# Patient Record
Sex: Female | Born: 2000 | Race: Black or African American | Hispanic: No | Marital: Single | Smoking: Never smoker
Health system: Southern US, Community
[De-identification: ages and names within clinical notes are randomized; demographics above are authoritative.]

## PROBLEM LIST (undated history)

## (undated) DIAGNOSIS — N200 Calculus of kidney: Secondary | ICD-10-CM

## (undated) DIAGNOSIS — J45909 Unspecified asthma, uncomplicated: Secondary | ICD-10-CM

## (undated) HISTORY — PX: LITHOTRIPSY: SUR834

---

## 2020-03-04 ENCOUNTER — Ambulatory Visit: Payer: Medicaid Other | Attending: Family

## 2020-03-04 DIAGNOSIS — Z23 Encounter for immunization: Secondary | ICD-10-CM

## 2020-03-11 ENCOUNTER — Other Ambulatory Visit: Payer: Self-pay

## 2020-03-23 NOTE — Progress Notes (Signed)
   Covid-19 Vaccination Clinic  Name:  Abagale Boulos    MRN: [id number] DOB: 08/27/2000  03/23/2020  Ms. Lees was observed post Covid-19 immunization for 15 minutes without incident. She was provided with Vaccine Information Sheet and instruction to access the V-Safe system.   Ms. Bhattacharyya was instructed to call 911 with any severe reactions post vaccine: Marland Kitchen Difficulty breathing  . Swelling of face and throat  . A fast heartbeat  . A bad rash all over body  . Dizziness and weakness   Immunizations Administered    Name Date Dose VIS Date Route   Pfizer COVID-19 Vaccine 03/04/2020  1:45 PM 0.3 mL 08/19/2018 Intramuscular   Manufacturer: [employer], [employer]   Lot: [id number]   NDC: [address]-9

## 2020-03-25 ENCOUNTER — Ambulatory Visit: Payer: Medicaid Other | Attending: Family

## 2020-03-25 DIAGNOSIS — Z23 Encounter for immunization: Secondary | ICD-10-CM

## 2020-05-02 NOTE — Progress Notes (Signed)
   Covid-19 Vaccination Clinic  Name:  Kelsey Oconnell    MRN: [id number] DOB: [birth year]  05/02/2020  [name] was observed post Covid-19 immunization for 15 minutes without incident. She was provided with Vaccine Information Sheet and instruction to access the V-Safe system.   [name] was instructed to call 911 with any severe reactions post vaccine: Marland Kitchen Difficulty breathing  . Swelling of face and throat  . A fast heartbeat  . A bad rash all over body  . Dizziness and weakness   Immunizations Administered    Name Date Dose VIS Date Route   Pfizer COVID-19 Vaccine 03/25/2020  5:00 PM 0.3 mL 04/13/2020 Intramuscular   Manufacturer: [employer], [employer]   Lot: [id number]   NDC: [address]-8

## 2020-12-08 ENCOUNTER — Encounter (HOSPITAL_COMMUNITY): Payer: Self-pay

## 2020-12-08 ENCOUNTER — Emergency Department (HOSPITAL_COMMUNITY): Payer: Medicaid Other

## 2020-12-08 ENCOUNTER — Emergency Department (HOSPITAL_COMMUNITY)
Admission: EM | Admit: 2020-12-08 | Discharge: 2020-12-08 | Disposition: A | Discharge status: Home / Self Care | Payer: Medicaid Other | Attending: Emergency Medicine | Admitting: Emergency Medicine

## 2020-12-08 DIAGNOSIS — N200 Calculus of kidney: Secondary | ICD-10-CM

## 2020-12-08 DIAGNOSIS — R1084 Generalized abdominal pain: Secondary | ICD-10-CM | POA: Diagnosis present

## 2020-12-08 DIAGNOSIS — R001 Bradycardia, unspecified: Secondary | ICD-10-CM | POA: Insufficient documentation

## 2020-12-08 DIAGNOSIS — R109 Unspecified abdominal pain: Secondary | ICD-10-CM

## 2020-12-08 DIAGNOSIS — J45909 Unspecified asthma, uncomplicated: Secondary | ICD-10-CM | POA: Insufficient documentation

## 2020-12-08 HISTORY — DX: Calculus of kidney: N20.0

## 2020-12-08 HISTORY — DX: Unspecified asthma, uncomplicated: J45.909

## 2020-12-08 LAB — CBC WITH DIFFERENTIAL/PLATELET
Abs Immature Granulocytes: 0.02 10*3/uL (ref 0.00–0.07)
Basophils Absolute: 0.1 10*3/uL (ref 0.0–0.1)
Basophils Relative: 1 %
Eosinophils Absolute: 0.2 10*3/uL (ref 0.0–0.5)
Eosinophils Relative: 2 %
HCT: 43.6 % (ref 36.0–46.0)
Hemoglobin: 14.5 g/dL (ref 12.0–15.0)
Immature Granulocytes: 0 %
Lymphocytes Relative: 21 %
Lymphs Abs: 1.5 10*3/uL (ref 0.7–4.0)
MCH: 29.5 pg (ref 26.0–34.0)
MCHC: 33.3 g/dL (ref 30.0–36.0)
MCV: 88.6 fL (ref 80.0–100.0)
Monocytes Absolute: 0.4 10*3/uL (ref 0.1–1.0)
Monocytes Relative: 6 %
Neutro Abs: 5 10*3/uL (ref 1.7–7.7)
Neutrophils Relative %: 70 %
Platelets: 251 10*3/uL (ref 150–400)
RBC: 4.92 MIL/uL (ref 3.87–5.11)
RDW: 13.3 % (ref 11.5–15.5)
WBC: 7.1 10*3/uL (ref 4.0–10.5)
nRBC: 0 % (ref 0.0–0.2)

## 2020-12-08 LAB — LIPASE, BLOOD: Lipase: 46 U/L (ref 11–51)

## 2020-12-08 LAB — URINALYSIS, MICROSCOPIC (REFLEX)
Bacteria, UA: NONE SEEN
RBC / HPF: >50 RBC/hpf (ref 0–5)

## 2020-12-08 LAB — COMPREHENSIVE METABOLIC PANEL
ALT: 16 U/L (ref 0–44)
AST: 19 U/L (ref 15–41)
Albumin: 4 g/dL (ref 3.5–5.0)
Alkaline Phosphatase: 53 U/L (ref 38–126)
Anion gap: 8 (ref 5–15)
BUN: 13 mg/dL (ref 6–20)
CO2: 27 mmol/L (ref 22–32)
Calcium: 9 mg/dL (ref 8.9–10.3)
Chloride: 106 mmol/L (ref 98–111)
Creatinine, Ser: 0.75 mg/dL (ref 0.44–1.00)
GFR, Estimated: >60 mL/min (ref >60)
Glucose, Bld: 100 mg/dL — ABNORMAL HIGH (ref 70–99)
Potassium: 3.5 mmol/L (ref 3.5–5.1)
Sodium: 141 mmol/L (ref 135–145)
Total Bilirubin: 1.5 mg/dL — ABNORMAL HIGH (ref 0.3–1.2)
Total Protein: 7.1 g/dL (ref 6.5–8.1)

## 2020-12-08 LAB — URINALYSIS, ROUTINE W REFLEX MICROSCOPIC
Bilirubin Urine: NEGATIVE
Glucose, UA: NEGATIVE mg/dL
Hgb urine dipstick: NEGATIVE
Ketones, ur: NEGATIVE mg/dL
Leukocytes,Ua: NEGATIVE
Nitrite: NEGATIVE
Protein, ur: 100 mg/dL — AB
Specific Gravity, Urine: 1.024 (ref 1.005–1.030)
pH: 5 (ref 5.0–8.0)

## 2020-12-08 LAB — I-STAT BETA HCG BLOOD, ED (MC, WL, AP ONLY): I-stat hCG, quantitative: <5 m[IU]/mL (ref <5)

## 2020-12-08 MED ORDER — KETOROLAC TROMETHAMINE 15 MG/ML IJ SOLN
15.0000 mg | Freq: Once | INTRAMUSCULAR | Status: AC
  Administered 2020-12-08: 15 mg via INTRAVENOUS
  Filled 2020-12-08: qty 1

## 2020-12-08 MED ORDER — SODIUM CHLORIDE 0.9 % IV BOLUS
500.0000 mL | Freq: Once | INTRAVENOUS | Status: AC
  Administered 2020-12-08: 500 mL via INTRAVENOUS

## 2020-12-08 MED ORDER — FENTANYL CITRATE (PF) 100 MCG/2ML IJ SOLN
50.0000 ug | INTRAMUSCULAR | Status: DC | PRN
  Administered 2020-12-08: 50 ug via INTRAVENOUS
  Filled 2020-12-08: qty 2

## 2020-12-08 NOTE — Discharge Instructions (Signed)
Use Tylenol every 4 hours and ibuprofen every 6 hours as needed for pain.  Stay well-hydrated.  For severe pain you can take Tylenol and ibuprofen every 6 hours together.  Strain to collect kidney stone.  Follow-up with urology closely.

## 2020-12-08 NOTE — ED Provider Notes (Signed)
[facility] EMERGENCY DEPARTMENT Provider Note   CSN: [id number] Arrival date & time: 12/08/20  8341     History Chief Complaint  Patient presents with   Back Pain    Kelsey Oconnell is a 20 y.o. female.  Patient with history of kidney stone, lithotripsy presents with worsening right flank pain radiating to anterior abdomen.  Worsening for the past 2 days initially intermittent now constant.  Pain now diffuse abdominal area.  Decreased appetite.  No vomiting, diarrhea, blood in the stools or dysuria.  Mild blood in the urine.  No concerns for STDs.  No other abdominal surgeries or procedures.      Past Medical History:  Diagnosis Date   Asthma    Kidney stones     There are no problems to display for this patient.   Past Surgical History:  Procedure Laterality Date   LITHOTRIPSY       OB History   No obstetric history on file.     No family history on file.     Home Medications Prior to Admission medications   Not on File    Allergies    Patient has no known allergies.  Review of Systems   Review of Systems  Constitutional:  Negative for chills and fever.  HENT:  Negative for congestion.   Eyes:  Negative for visual disturbance.  Respiratory:  Negative for shortness of breath.   Cardiovascular:  Negative for chest pain.  Gastrointestinal:  Positive for abdominal pain and nausea. Negative for vomiting.  Genitourinary:  Negative for dysuria and flank pain.  Musculoskeletal:  Negative for back pain, neck pain and neck stiffness.  Skin:  Negative for rash.  Neurological:  Negative for light-headedness and headaches.   Physical Exam Updated Vital Signs BP 105/69   Pulse (!) 56   Temp 98.2 F (36.8 C) (Oral)   Resp 12   SpO2 100%   Physical Exam Vitals and nursing note reviewed.  Constitutional:      General: She is not in acute distress.    Appearance: She is well-developed.  HENT:     Head: Normocephalic and atraumatic.      Mouth/Throat:     Mouth: Mucous membranes are moist.  Eyes:     General:        Right eye: No discharge.        Left eye: No discharge.     Conjunctiva/sclera: Conjunctivae normal.  Neck:     Trachea: No tracheal deviation.  Cardiovascular:     Rate and Rhythm: Regular rhythm. Bradycardia present.     Heart sounds: No murmur heard. Pulmonary:     Effort: Pulmonary effort is normal.     Breath sounds: Normal breath sounds.  Abdominal:     General: There is no distension.     Palpations: Abdomen is soft.     Tenderness: There is abdominal tenderness (mid abdomen diffuse). There is no guarding.  Musculoskeletal:        General: Tenderness (right flank) present. No swelling.     Cervical back: Normal range of motion and neck supple. No rigidity.  Skin:    General: Skin is warm.     Capillary Refill: Capillary refill takes less than 2 seconds.     Findings: No rash.  Neurological:     General: No focal deficit present.     Mental Status: She is alert.     Cranial Nerves: No cranial nerve deficit.  Psychiatric:  Mood and Affect: Mood normal.    ED Results / Procedures / Treatments   Labs (all labs ordered are listed, but only abnormal results are displayed) Labs Reviewed  URINALYSIS, ROUTINE W REFLEX MICROSCOPIC - Abnormal; Notable for the following components:      Result Value   Color, Urine AMBER (*)    APPearance CLOUDY (*)    Protein, ur 100 (*)    All other components within normal limits  COMPREHENSIVE METABOLIC PANEL - Abnormal; Notable for the following components:   Glucose, Bld 100 (*)    Total Bilirubin 1.5 (*)    All other components within normal limits  URINALYSIS, MICROSCOPIC (REFLEX)  CBC WITH DIFFERENTIAL/PLATELET  LIPASE, BLOOD  I-STAT BETA HCG BLOOD, ED ([facility], [facility], [facility] ONLY)    EKG None  Radiology CT Renal Stone Study  Result Date: 12/08/2020 CLINICAL DATA:  Right flank pain and urinary frequency for 2 days, RIGHT flank and abdominal pain,  suspected kidney stone EXAM: CT ABDOMEN AND PELVIS WITHOUT CONTRAST TECHNIQUE: Multidetector CT imaging of the abdomen and pelvis was performed following the standard protocol without IV contrast. Sagittal and coronal MPR images reconstructed from axial data set. No oral contrast administered. COMPARISON:  None FINDINGS: Lower chest: Lung bases clear Hepatobiliary: Gallbladder and liver normal appearance Pancreas: Normal appearance Spleen: Normal appearance Adrenals/Urinary Tract: Adrenal glands normal appearance. BILATERAL nonobstructing renal calculi. Mild RIGHT hydronephrosis. In addition, calculus identified inferior to the RIGHT kidney 5 mm diameter image 34, question UPJ calculus; due to lack of fat planes, position of the ureteropelvic junction is not well delineated. No calcifications along the expected courses of the ureters. Bladder unremarkable. Stomach/Bowel: Stomach and bowel loops grossly unremarkable for technique Vascular/Lymphatic: Aorta normal caliber.  No adenopathy. Reproductive: Unremarkable uterus and adnexa Other: No free air or free fluid.  No hernia. Musculoskeletal: Osseous structures unremarkable. IMPRESSION: BILATERAL nonobstructing renal calculi. Mild RIGHT hydronephrosis due to a 5 mm probable UPJ calculus. Electronically Signed   By: Ulyses Southward M.D.   On: 12/08/2020 10:12    Procedures Procedures   Medications Ordered in ED Medications  fentaNYL (SUBLIMAZE) injection 50 mcg (50 mcg Intravenous Given 12/08/20 0828)  sodium chloride 0.9 % bolus 500 mL (0 mLs Intravenous Stopped 12/08/20 0928)  ketorolac (TORADOL) 15 MG/ML injection 15 mg (15 mg Intravenous Given 12/08/20 4656)    ED Course  I have reviewed the triage vital signs and the nursing notes.  Pertinent labs & imaging results that were available during my care of the patient were reviewed by me and considered in my medical decision making (see chart for details).    MDM Rules/Calculators/A&P                           Patient presents with worsening flank and abdominal pain.  Patient very uncomfortable in the room.  Pain medicines ordered, urine to look for signs of infection, bleeding, basic blood work to check kidney function, hemoglobin white blood cell count.  Patient's pain improved in the ER, pregnancy test negative, Toradol added to the fentanyl initial dose.  Urinalysis no signs of infection, RBCs present.  Concern clinically for kidney stone versus pyelo vs musculoskeletal versus bowel related versus appendicitis versus other.  CT stone study pending.  Blood work reviewed reassuring, normal kidney function, normal hemoglobin, normal electrolytes, normal white blood cell count.  Urinalysis showed RBCs without signs of infection.  CT scan results reviewed showing kidney stone 5 mm.  Pain controlled on reassessment.  Follow-up with urology discussed.  Final Clinical Impression(s) / ED Diagnoses Final diagnoses:  Acute right flank pain  Kidney stone    Rx / DC Orders ED Discharge Orders     None        Blane Ohara, MD 12/08/20 1134

## 2020-12-08 NOTE — ED Provider Notes (Signed)
Emergency Medicine Provider Triage Evaluation Note  Kelsey Oconnell , a 20 y.o. female  was evaluated in triage.  Pt complains of back pain on and off for the past 2 days.  Localized to lower mid back.  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  Denies urinary symptoms.  No meds taken PTA.  Review of Systems  Positive: Back pain Negative: Numbness, weakness  Physical Exam  BP (!) 131/101 (BP Location: Left Arm)   Pulse 73   Temp 98.2 F (36.8 C) (Oral)   Resp 17   Gen:   Awake, no distress   Resp:  Normal effort  MSK:   Moves extremities without difficulty   Medical Decision Making  Medically screening exam initiated at 6:29 AM.  Appropriate orders placed.  Kelsey Oconnell was informed that the remainder of the evaluation will be completed by another provider, this initial triage assessment does not replace that evaluation, and the importance of remaining in the ED until their evaluation is complete.    Kelsey Hatchet, PA-C 12/08/20 0631    Tegeler, Kelsey Brim, MD 12/08/20 [phone]

## 2020-12-08 NOTE — ED Triage Notes (Signed)
Pt states that she has been having lower back pain on and off for the past two days,denies injury or urinary symptoms

## 2020-12-12 ENCOUNTER — Other Ambulatory Visit: Payer: Self-pay | Admitting: Urology

## 2020-12-12 DIAGNOSIS — N201 Calculus of ureter: Secondary | ICD-10-CM

## 2020-12-15 NOTE — Progress Notes (Signed)
Talked with patient . Arrival time 0800 cl. Liquids to 0600 mom is the driver. Instructions given. Meds reviewed

## 2020-12-16 NOTE — H&P (Signed)
Office Visit Report     12/09/2020   --------------------------------------------------------------------------------   Kelsey Oconnell  MRN: [id number]  DOB: [birth year], 20 year old Female  SSN:    PRIMARY CARE:    REFERRING:    PROVIDER:  Jerilee Field, M.D.  LOCATION:  [facility] [phone]     --------------------------------------------------------------------------------   CC/HPI: New pt -   1) ureteral stone - right flank pain and 12/08/2020 CT with 5 mm right proximal stone visible on the scout image. Narrow SSD. Wbc 7.1, Cr 0.75. UA no bac, > 50 rbc. She has no pain meds. No tamsulosin. Her pain improved and she is staying hydrated.    2) kidney stones - CT 12/08/2020 with bilateral small stone and nephrocalcinosis appearance. Largest stone 3-4 mm RLP. Visible.    She has a h/o kidney stones. She was treated in [place]. She had ESWL.    Today, she is seen for the above.    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     ALLERGIES: None   MEDICATIONS: Nexplanon  Tylenol Extra Strength 500 mg tablet     GU PSH: ESWL     NON-GU PSH: None   GU PMH: Renal calculus    NON-GU PMH: Asthma    FAMILY HISTORY: Breast Cancer - Mother Death of family member - Father Diabetes - Runs in Family Hypertension - Runs in Family   SOCIAL HISTORY: Marital Status: Single Preferred Language: English; Ethnicity: Not Hispanic Or Latino; Race: Black or African American Current Smoking Status: Patient has never smoked.   Tobacco Use Assessment Completed: Used Tobacco in last 30 days? Does not use smokeless tobacco. Has never drank.  Does not drink caffeine. Patient's occupation Printmaker.    REVIEW OF SYSTEMS:    GU Review Female:   Patient reports frequent urination and get up at night to urinate. Patient denies hard to postpone urination, burning /pain with urination, leakage of urine, stream starts and stops, trouble  starting your stream, have to strain to urinate, and being pregnant.  Gastrointestinal (Upper):   Patient denies vomiting, indigestion/ heartburn, and nausea.  Gastrointestinal (Lower):   Patient denies diarrhea and constipation.  Constitutional:   Patient reports fatigue. Patient denies fever, night sweats, and weight loss.  Skin:   Patient denies skin rash/ lesion and itching.  Eyes:   Patient denies blurred vision and double vision.  Ears/ Nose/ Throat:   Patient denies sore throat and sinus problems.  Hematologic/Lymphatic:   Patient denies swollen glands and easy bruising.  Cardiovascular:   Patient denies leg swelling and chest pains.  Respiratory:   Patient denies cough and shortness of breath.  Endocrine:   Patient denies excessive thirst.  Musculoskeletal:   Patient reports back pain. Patient denies joint pain.  Neurological:   Patient denies headaches and dizziness.  Psychologic:   Patient denies depression and anxiety.   VITAL SIGNS:      12/09/2020 01:07 PM  Weight 110 lb / 49.9 kg  BP 134/82 mmHg  Pulse 77 /min  Temperature 97.7 F / 36.5 C   MULTI-SYSTEM PHYSICAL EXAMINATION:    Constitutional: Well-nourished. No physical deformities. Normally developed. Good grooming.  Neck: Neck symmetrical, not swollen. Normal tracheal position.  Respiratory: No labored breathing, no use of accessory muscles.   Cardiovascular: Normal temperature, normal extremity pulses, no swelling, no varicosities.  Skin: No paleness, no jaundice, no cyanosis. No lesion, no ulcer, no rash.  Neurologic /  Psychiatric: Oriented to time, oriented to place, oriented to person. No depression, no anxiety, no agitation.  Gastrointestinal: No mass, no tenderness, no rigidity, non obese abdomen.  Eyes: Normal conjunctivae. Normal eyelids.  Ears, Nose, Mouth, and Throat: Left ear no scars, no lesions, no masses. Right ear no scars, no lesions, no masses. Nose no scars, no lesions, no masses. Normal hearing.  Normal lips.  Musculoskeletal: Normal gait and station of head and neck.     PAST DATA REVIEW: None   PROCEDURES:          Urinalysis w/Scope Dipstick Dipstick Cont'd Micro  Color: Amber Bilirubin: Neg mg/dL WBC/hpf: 0 - 5/hpf  Appearance: Cloudy Ketones: Neg mg/dL RBC/hpf: >29/FAO  Specific Gravity: 1.020 Blood: 3+ ery/uL Bacteria: Few (10-25/hpf)  pH: 6.0 Protein: 1+ mg/dL Cystals: NS (Not Seen)  Glucose: Neg mg/dL Urobilinogen: 1.0 mg/dL Casts: NS (Not Seen)    Nitrites: Neg Trichomonas: Not Present    Leukocyte Esterase: Neg leu/uL Mucous: Not Present      Epithelial Cells: 6 - 10/hpf      Yeast: NS (Not Seen)      Sperm: Not Present         Ketoralac 30mg  - , [id number] Qty: 30 Adm. By: [id number]  Unit: mg Lot No Julien Nordmann  Route: IM Exp. Date 08/22/2021  Freq: None Mfgr.:   Site: Right Buttock   ASSESSMENT:      ICD-10 Details  1 GU:   Renal calculus - N20.0 Chronic, Stable - Discussed stone prevention and after this episode she would benefit from labs and 24 hr urine.   2   Ureteral calculus - N20.1 Chronic, Stable - She had 5/10 pain today and a little uncomfortable. I gave her ketorolac 30 mg IM. She felt much better. I discussed with the patient the nature risks and benefits of continued stone passage, off label use of alpha blockers, shockwave lithotripsy or ureteroscopy. All questions answered. She will pursue ESWL, Right. Discussed a colleague would be doing the procedure.    PLAN:            Medications New Meds: Tamsulosin Hcl 0.4 mg capsule 1 capsule PO Daily   #14  0 Refill(s)  Hydrocodone-Acetaminophen 5 mg-325 mg tablet 1 tablet PO Q 6 H PRN   #15  0 Refill(s)            Orders Labs Urine Culture          Schedule Return Visit/Planned Activity: ASAP - Schedule Surgery          Document Letter(s):  Created for Patient: Clinical Summary    * Signed by 08/24/2021, M.D. on 12/12/20 at 9:23 AM (EDT*

## 2020-12-19 ENCOUNTER — Encounter (HOSPITAL_BASED_OUTPATIENT_CLINIC_OR_DEPARTMENT_OTHER): Payer: Self-pay | Admitting: Urology

## 2020-12-19 ENCOUNTER — Encounter (HOSPITAL_BASED_OUTPATIENT_CLINIC_OR_DEPARTMENT_OTHER)
Admission: RE | Disposition: A | Discharge status: Home / Self Care | Payer: Self-pay | Source: Home / Self Care | Attending: Urology

## 2020-12-19 ENCOUNTER — Ambulatory Visit (HOSPITAL_BASED_OUTPATIENT_CLINIC_OR_DEPARTMENT_OTHER): Payer: Medicaid Other | Admitting: Anesthesiology

## 2020-12-19 ENCOUNTER — Other Ambulatory Visit: Payer: Self-pay

## 2020-12-19 ENCOUNTER — Ambulatory Visit (HOSPITAL_COMMUNITY): Payer: Medicaid Other

## 2020-12-19 ENCOUNTER — Ambulatory Visit (HOSPITAL_BASED_OUTPATIENT_CLINIC_OR_DEPARTMENT_OTHER)
Admission: RE | Admit: 2020-12-19 | Discharge: 2020-12-19 | Disposition: A | Discharge status: Home / Self Care | Payer: Medicaid Other | Attending: Urology | Admitting: Urology

## 2020-12-19 DIAGNOSIS — Z833 Family history of diabetes mellitus: Secondary | ICD-10-CM | POA: Insufficient documentation

## 2020-12-19 DIAGNOSIS — Z87442 Personal history of urinary calculi: Secondary | ICD-10-CM | POA: Diagnosis not present

## 2020-12-19 DIAGNOSIS — Z79899 Other long term (current) drug therapy: Secondary | ICD-10-CM | POA: Insufficient documentation

## 2020-12-19 DIAGNOSIS — N202 Calculus of kidney with calculus of ureter: Secondary | ICD-10-CM | POA: Diagnosis present

## 2020-12-19 DIAGNOSIS — N201 Calculus of ureter: Secondary | ICD-10-CM | POA: Diagnosis not present

## 2020-12-19 DIAGNOSIS — Z8249 Family history of ischemic heart disease and other diseases of the circulatory system: Secondary | ICD-10-CM | POA: Diagnosis not present

## 2020-12-19 HISTORY — PX: CYSTOSCOPY/RETROGRADE/URETEROSCOPY/STONE EXTRACTION WITH BASKET: SHX5317

## 2020-12-19 LAB — POCT PREGNANCY, URINE: Preg Test, Ur: NEGATIVE

## 2020-12-19 SURGERY — CYSTOSCOPY, WITH CALCULUS REMOVAL USING BASKET
Anesthesia: General | Site: Urethra | Laterality: Right

## 2020-12-19 MED ORDER — MIDAZOLAM HCL 2 MG/2ML IJ SOLN
INTRAMUSCULAR | Status: AC
  Filled 2020-12-19: qty 2

## 2020-12-19 MED ORDER — FENTANYL CITRATE (PF) 100 MCG/2ML IJ SOLN: 25.0000 ug | INTRAMUSCULAR | Status: DC | PRN

## 2020-12-19 MED ORDER — PROPOFOL 500 MG/50ML IV EMUL
INTRAVENOUS | Status: AC
  Filled 2020-12-19: qty 50

## 2020-12-19 MED ORDER — DIAZEPAM 5 MG PO TABS
10.0000 mg | ORAL_TABLET | ORAL | Status: AC
  Administered 2020-12-19: 10 mg via ORAL

## 2020-12-19 MED ORDER — CIPROFLOXACIN HCL 500 MG PO TABS
ORAL_TABLET | ORAL | Status: AC
  Filled 2020-12-19: qty 1

## 2020-12-19 MED ORDER — ONDANSETRON HCL 4 MG/2ML IJ SOLN
INTRAMUSCULAR | Status: AC
  Filled 2020-12-19: qty 2

## 2020-12-19 MED ORDER — HYDROCODONE-ACETAMINOPHEN 5-325 MG PO TABS: 1.0000 | ORAL_TABLET | Freq: Four times a day (QID) | ORAL | 0 refills | Status: DC | PRN

## 2020-12-19 MED ORDER — MIDAZOLAM HCL 5 MG/5ML IJ SOLN
INTRAMUSCULAR | Status: DC | PRN
  Administered 2020-12-19: 2 mg via INTRAVENOUS

## 2020-12-19 MED ORDER — ONDANSETRON HCL 4 MG/2ML IJ SOLN
INTRAMUSCULAR | Status: DC | PRN
  Administered 2020-12-19: 4 mg via INTRAVENOUS

## 2020-12-19 MED ORDER — DIPHENHYDRAMINE HCL 25 MG PO CAPS
25.0000 mg | ORAL_CAPSULE | ORAL | Status: AC
  Administered 2020-12-19: 25 mg via ORAL

## 2020-12-19 MED ORDER — DEXAMETHASONE SODIUM PHOSPHATE 4 MG/ML IJ SOLN
INTRAMUSCULAR | Status: DC | PRN
  Administered 2020-12-19: 4 mg via INTRAVENOUS

## 2020-12-19 MED ORDER — FENTANYL CITRATE (PF) 100 MCG/2ML IJ SOLN
INTRAMUSCULAR | Status: AC
  Filled 2020-12-19: qty 2

## 2020-12-19 MED ORDER — AMISULPRIDE (ANTIEMETIC) 5 MG/2ML IV SOLN: 10.0000 mg | Freq: Once | INTRAVENOUS | Status: DC | PRN

## 2020-12-19 MED ORDER — OXYCODONE HCL 5 MG PO TABS
5.0000 mg | ORAL_TABLET | Freq: Once | ORAL | Status: AC | PRN
  Administered 2020-12-19: 5 mg via ORAL

## 2020-12-19 MED ORDER — FENTANYL CITRATE (PF) 100 MCG/2ML IJ SOLN
INTRAMUSCULAR | Status: DC | PRN
  Administered 2020-12-19: 50 ug via INTRAVENOUS
  Administered 2020-12-19 (×2): 25 ug via INTRAVENOUS

## 2020-12-19 MED ORDER — OXYCODONE HCL 5 MG PO TABS
ORAL_TABLET | ORAL | Status: AC
  Filled 2020-12-19: qty 1

## 2020-12-19 MED ORDER — LIDOCAINE 2% (20 MG/ML) 5 ML SYRINGE
INTRAMUSCULAR | Status: DC | PRN
  Administered 2020-12-19: 60 mg via INTRAVENOUS

## 2020-12-19 MED ORDER — SODIUM CHLORIDE 0.9 % IV SOLN: INTRAVENOUS | Status: DC

## 2020-12-19 MED ORDER — DIAZEPAM 5 MG PO TABS
ORAL_TABLET | ORAL | Status: AC
  Filled 2020-12-19: qty 2

## 2020-12-19 MED ORDER — PROPOFOL 10 MG/ML IV BOLUS
INTRAVENOUS | Status: DC | PRN
  Administered 2020-12-19: 50 mg via INTRAVENOUS
  Administered 2020-12-19: 200 mg via INTRAVENOUS

## 2020-12-19 MED ORDER — CIPROFLOXACIN HCL 500 MG PO TABS
500.0000 mg | ORAL_TABLET | ORAL | Status: AC
  Administered 2020-12-19: 500 mg via ORAL

## 2020-12-19 MED ORDER — LIDOCAINE HCL (PF) 2 % IJ SOLN
INTRAMUSCULAR | Status: AC
  Filled 2020-12-19: qty 5

## 2020-12-19 MED ORDER — OXYCODONE HCL 5 MG/5ML PO SOLN: 5.0000 mg | Freq: Once | ORAL | Status: AC | PRN

## 2020-12-19 MED ORDER — DIPHENHYDRAMINE HCL 25 MG PO CAPS
ORAL_CAPSULE | ORAL | Status: AC
  Filled 2020-12-19: qty 1

## 2020-12-19 MED ORDER — DEXAMETHASONE SODIUM PHOSPHATE 4 MG/ML IJ SOLN: INTRAMUSCULAR | Status: DC | PRN

## 2020-12-19 MED ORDER — DEXAMETHASONE SODIUM PHOSPHATE 10 MG/ML IJ SOLN
INTRAMUSCULAR | Status: AC
  Filled 2020-12-19: qty 1

## 2020-12-19 MED ORDER — ONDANSETRON HCL 4 MG/2ML IJ SOLN: 4.0000 mg | Freq: Once | INTRAMUSCULAR | Status: DC | PRN

## 2020-12-19 MED ORDER — SODIUM CHLORIDE 0.9 % IR SOLN
Status: DC | PRN
  Administered 2020-12-19: 3000 mL

## 2020-12-19 SURGICAL SUPPLY — 21 items
BAG DRAIN URO-CYSTO SKYTR STRL (DRAIN) ×4 IMPLANT
BASKET ZERO TIP NITINOL 2.4FR (BASKET) ×4 IMPLANT
CATH INTERMIT  6FR 70CM (CATHETERS) ×4 IMPLANT
CLOTH BEACON ORANGE TIMEOUT ST (SAFETY) ×4 IMPLANT
COVER DOME SNAP 22 D (MISCELLANEOUS) ×4 IMPLANT
DRSG TEGADERM 2-3/8X2-3/4 SM (GAUZE/BANDAGES/DRESSINGS) ×4 IMPLANT
FIBER LASER FLEXIVA 365 (UROLOGICAL SUPPLIES) ×4 IMPLANT
GLOVE SURG ENC MOIS LTX SZ7.5 (GLOVE) ×4 IMPLANT
GLOVE SURG UNDER POLY LF SZ6.5 (GLOVE) ×8 IMPLANT
GOWN STRL REUS W/ TWL LRG LVL3 (GOWN DISPOSABLE) ×2 IMPLANT
GOWN STRL REUS W/TWL LRG LVL3 (GOWN DISPOSABLE) ×6 IMPLANT
GUIDEWIRE STR DUAL SENSOR (WIRE) ×4 IMPLANT
IV NS IRRIG 3000ML ARTHROMATIC (IV SOLUTION) ×4 IMPLANT
KIT TURNOVER CYSTO (KITS) ×4 IMPLANT
MANIFOLD NEPTUNE II (INSTRUMENTS) ×4 IMPLANT
NS IRRIG 500ML POUR BTL (IV SOLUTION) ×4 IMPLANT
PACK CYSTO (CUSTOM PROCEDURE TRAY) ×4 IMPLANT
STENT URET 6FRX24 CONTOUR (STENTS) ×4 IMPLANT
TUBE CONNECTING 12'X1/4 (SUCTIONS) ×1
TUBE CONNECTING 12X1/4 (SUCTIONS) ×3 IMPLANT
TUBING UROLOGY SET (TUBING) ×4 IMPLANT

## 2020-12-19 NOTE — Anesthesia Procedure Notes (Signed)
Procedure Name: LMA Insertion Date/Time: 12/19/2020 11:32 AM Performed by: Burna Cash, CRNA Pre-anesthesia Checklist: Patient identified, Emergency Drugs available, Suction available and Patient being monitored Patient Re-evaluated:Patient Re-evaluated prior to induction Oxygen Delivery Method: Circle system utilized Preoxygenation: Pre-oxygenation with 100% oxygen Induction Type: IV induction Ventilation: Mask ventilation without difficulty LMA: LMA inserted LMA Size: 4.0 Number of attempts: 1 Airway Equipment and Method: Bite block Placement Confirmation: positive ETCO2 Tube secured with: Tape Dental Injury: Teeth and Oropharynx as per pre-operative assessment

## 2020-12-19 NOTE — Anesthesia Preprocedure Evaluation (Signed)
Anesthesia Evaluation  Patient identified by MRN, date of birth, ID band Patient awake    Reviewed: Allergy & Precautions, NPO status , Patient's Chart, lab work & pertinent test results  History of Anesthesia Complications Negative for: history of anesthetic complications  Airway Mallampati: II  TM Distance: >3 FB Neck ROM: Full    Dental  (+) Teeth Intact, Dental Advisory Given   Pulmonary asthma ,    Pulmonary exam normal        Cardiovascular negative cardio ROS Normal cardiovascular exam     Neuro/Psych negative neurological ROS     GI/Hepatic negative GI ROS, Neg liver ROS,   Endo/Other  negative endocrine ROS  Renal/GU Renal disease (L ureteral stone)  negative genitourinary   Musculoskeletal negative musculoskeletal ROS (+)   Abdominal   Peds  Hematology negative hematology ROS (+)   Anesthesia Other Findings   Reproductive/Obstetrics                           Anesthesia Physical Anesthesia Plan  ASA: 2  Anesthesia Plan: General   Post-op Pain Management:    Induction: Intravenous  PONV Risk Score and Plan: 3 and Ondansetron, Dexamethasone, Midazolam and Treatment may vary due to age or medical condition  Airway Management Planned: LMA  Additional Equipment: None  Intra-op Plan:   Post-operative Plan: Extubation in OR  Informed Consent: I have reviewed the patients History and Physical, chart, labs and discussed the procedure including the risks, benefits and alternatives for the proposed anesthesia with the patient or authorized representative who has indicated his/her understanding and acceptance.     Dental advisory given  Plan Discussed with:   Anesthesia Plan Comments:         Anesthesia Quick Evaluation

## 2020-12-19 NOTE — Anesthesia Postprocedure Evaluation (Signed)
Anesthesia Post Note  Patient: Print production planner  Procedure(s) Performed: CYSTOSCOPY/URETEROSCOPY/STONE EXTRACTION WITH BASKET/ STENT PLACEMENT (Right: Urethra)     Patient location during evaluation: PACU Anesthesia Type: General Level of consciousness: awake and alert Pain management: pain level controlled Vital Signs Assessment: post-procedure vital signs reviewed and stable Respiratory status: spontaneous breathing, nonlabored ventilation and respiratory function stable Cardiovascular status: blood pressure returned to baseline and stable Postop Assessment: no apparent nausea or vomiting Anesthetic complications: no   No notable events documented.  Last Vitals:  Vitals:   12/19/20 1230 12/19/20 1245  BP: 135/76 118/76  Pulse: (!) 109 (!) 48  Resp: (!) 29 (!) 21  Temp:    SpO2: 95% 100%    Last Pain:  Vitals:   12/19/20 1245  TempSrc:   PainSc: 4                  Lucretia Kern

## 2020-12-19 NOTE — Interval H&P Note (Signed)
History and Physical Interval Note:  12/19/2020 8:54 AM  Kelsey Oconnell  has presented today for surgery, with the diagnosis of RIGHT URETERAL STONE.  The various methods of treatment have been discussed with the patient and family. After consideration of risks, benefits and other options for treatment, the patient has consented to  Procedure(s): EXTRACORPOREAL SHOCK WAVE LITHOTRIPSY (ESWL) (Right) as a surgical intervention.  The patient's history has been reviewed, patient examined, no change in status, stable for surgery.  I have reviewed the patient's chart and labs.  Questions were answered to the patient's satisfaction.     [employer]

## 2020-12-19 NOTE — Discharge Instructions (Addendum)
You may see some blood in the urine and may have some burning with urination for 48-72 hours. You also may notice that you have to urinate more frequently or urgently after your procedure which is normal.  You should call should you develop an inability urinate, fever > 101, persistent nausea and vomiting that prevents you from eating or drinking to stay hydrated.  If you have a stent, you will likely urinate more frequently and urgently until the stent is removed and you may experience some discomfort/pain in the lower abdomen and flank especially when urinating. You may take pain medication prescribed to you if needed for pain. You may also intermittently have blood in the urine until the stent is removed. You may remove your stent on Friday morning.  Simply pull the string that is taped to your body and the stent will easily come out.  This may be best done in the shower as some urine may come out with the stent.  Usually you will feel relief once the stent is removed, but occasionally patients can develop pain due to residual swelling of the ureter that may temporarily obstruct the kidney.  This can be managed by taking pain medication and it will typically resolve with time.  Please do not hesitate to call if you have pain that is not controlled with your pain medication or does not improved within 24-48 hours.   Post Anesthesia Home Care Instructions  Activity: Get plenty of rest for the remainder of the day. A responsible individual must stay with you for 24 hours following the procedure.  For the next 24 hours, DO NOT: -Drive a car -Advertising copywriter -Drink alcoholic beverages -Take any medication unless instructed by your physician -Make any legal decisions or sign important papers.  Meals: Start with liquid foods such as gelatin or soup. Progress to regular foods as tolerated. Avoid greasy, spicy, heavy foods. If nausea and/or vomiting occur, drink only clear liquids until the nausea  and/or vomiting subsides. Call your physician if vomiting continues.  Special Instructions/Symptoms: Your throat may feel dry or sore from the anesthesia or the breathing tube placed in your throat during surgery. If this causes discomfort, gargle with warm salt water. The discomfort should disappear within 24 hours. [facility] [phone] Post Ureteroscopy With or Without Stent Instructions  Definitions:  Ureter: The duct that transports urine from the kidney to the bladder. Stent:   A plastic hollow tube that is placed into the ureter, from the kidney to the bladder to prevent the ureter from swelling shut.  GENERAL INSTRUCTIONS:  Despite the fact that no skin incisions were used, the area around the ureter and bladder is raw and irritated. The stent is a foreign body which will further irritate the bladder wall. This irritation is manifested by increased frequency of urination, both day and night, and by an increase in the urge to urinate. In some, the urge to urinate is present almost always. Sometimes the urge is strong enough that you may not be able to stop yourself from urinating. The only real cure is to remove the stent and then give time for the bladder wall to heal which can't be done until the danger of the ureter swelling shut has passed, which varies.  You may see some blood in your urine while the stent is in place and a few days afterwards. Do not be alarmed, even if the urine was clear for a while. Get off your feet and drink lots  of fluids until clearing occurs. If you start to pass clots or don't improve, call us.  DIET: You may return to your normal diet immediately. Because of the raw surface of your bladder, alcohol, spicy foods, acid type foods and drinks with caffeine may cause irritation or frequency and should be used in moderation. To keep your urine flowing freely and to avoid constipation, drink plenty of fluids during the day ( 8-10 glasses  ). Tip: Avoid cranberry juice because it is very acidic.  ACTIVITY: Your physical activity doesn't need to be restricted. However, if you are very active, you may see some blood in your urine. We suggest that you reduce your activity under these circumstances until the bleeding has stopped.  BOWELS: It is important to keep your bowels regular during the postoperative period. Straining with bowel movements can cause bleeding. A bowel movement every other day is reasonable. Use a mild laxative if needed, such as Milk of Magnesia 2-3 tablespoons, or 2 Dulcolax tablets. Call if you continue to have problems. If you have been taking narcotics for pain, before, during or after your surgery, you may be constipated. Take a laxative if necessary.   MEDICATION: You should resume your pre-surgery medications unless told not to. In addition you will often be given an antibiotic to prevent infection. These should be taken as prescribed until the bottles are finished unless you are having an unusual reaction to one of the drugs.  PROBLEMS YOU SHOULD REPORT TO [place]: Fevers over 100.5 Fahrenheit. Heavy bleeding, or clots ( See above notes about blood in urine ). Inability to urinate. Drug reactions ( hives, rash, nausea, vomiting, diarrhea ). Severe burning or pain with urination that is not improving.  FOLLOW-UP: You will need a follow-up appointment to monitor your progress. Call for this appointment at the number listed above. Usually the first appointment will be about three to fourteen days after your surgery.

## 2020-12-19 NOTE — Interval H&P Note (Signed)
History and Physical Interval Note:  12/19/2020 10:24 AM  Kelsey Oconnell  presented for ESWL of a proximal right ureteral stone.  It was noted on imaging today that her stone had migrated to the distal ureter.  After reviewing the risk of treating a distal stone including the possibility of injury to her ovary, etc, she elected to avoid ESWL.  We reviewed alternatives and she did wish to proceed with cystoscopy, right ureteroscopic laser lithotripsy and possible right ureteral stent instead. I discussed the potential benefits and risks of the procedure, side effects of the proposed treatment, the likelihood of the patient achieving the goals of the procedure, and any potential problems that might occur during the procedure or recuperation.  She gives informed consent to proceed.  I also spoke with her mother and reviewed the risks and benefits of this procedure.  [employer]

## 2020-12-19 NOTE — Transfer of Care (Signed)
Immediate Anesthesia Transfer of Care Note  Patient: Kelsey Oconnell  Procedure(s) Performed: CYSTOSCOPY/URETEROSCOPY/STONE EXTRACTION WITH BASKET/ STENT PLACEMENT (Right: Urethra)  Patient Location: PACU  Anesthesia Type:General  Level of Consciousness: drowsy and responds to stimulation  Airway & Oxygen Therapy: Patient Spontanous Breathing and Patient connected to face mask oxygen  Post-op Assessment: Report given to RN and Post -op Vital signs reviewed and stable  Post vital signs: Reviewed and stable  Last Vitals:  Vitals Value Taken Time  BP 135/73 12/19/20 1226  Temp    Pulse 109 12/19/20 1229  Resp 29 12/19/20 1229  SpO2 95 % 12/19/20 1229  Vitals shown include unvalidated device data.  Last Pain:  Vitals:   12/19/20 0834  TempSrc: Oral  PainSc: 6          Complications: No notable events documented.

## 2020-12-19 NOTE — Op Note (Signed)
Preoperative diagnosis: Right distal ureteral calculus  Postoperative diagnosis: Right distal ureteral calculus  Procedure:  Cystoscopy Right ureteroscopy and stone removal Ureteroscopic laser lithotripsy Right ureteral stent placement ([id number] with string)   Surgeon: Moody Bruins. M.D.  Anesthesia: General  Complications: None  EBL: Minimal  Specimens: Right ureteral calculus  Disposition of specimens: [facility] for stone analysis  Indication: Kelsey Oconnell is a 20 y.o. year old patient with urolithiasis. After reviewing the management options for treatment, the patient elected to proceed with the above surgical procedure(s). We have discussed the potential benefits and risks of the procedure, side effects of the proposed treatment, the likelihood of the patient achieving the goals of the procedure, and any potential problems that might occur during the procedure or recuperation. Informed consent has been obtained.  Description of procedure:  The patient was taken to the operating room and general anesthesia was induced.  The patient was placed in the dorsal lithotomy position, prepped and draped in the usual sterile fashion, and preoperative antibiotics were administered. A preoperative time-out was performed.   Cystourethroscopy was performed.  The patient's urethra was examined and was normal. The bladder was then systematically examined in its entirety. There was no evidence for any bladder tumors, stones, or other mucosal pathology.    Attention then turned to the right ureteral orifice and a ureteral catheter was attempted to be used to intubate the ureteral orifice.  However, the 5 Fr catheter was too large to safely cannulate to small orifice.  A 0.38 sensor guidewire was then advanced up the right ureter into the renal pelvis under fluoroscopic guidance. The 6 Fr semirigid ureteroscope was then advanced into the ureter without difficulty next to the  guidewire and the calculus was identified.  The stone was then fragmented with the 365 micron holmium laser fiber on a setting of 0.6 J and frequency of 6 Hz.   All stones were then removed from the ureter with a zero tip nitinol basket.  Reinspection of the ureter revealed no remaining visible stones or fragments.   The wire was then backloaded through the cystoscope and a ureteral stent was advance over the wire using Seldinger technique.  The stent was positioned appropriately under fluoroscopic and cystoscopic guidance.  The wire was then removed with an adequate stent curl noted in the renal pelvis as well as in the bladder.  The bladder was then emptied and the procedure ended.  The patient appeared to tolerate the procedure well and without complications.  The patient was able to be awakened and transferred to the recovery unit in satisfactory condition.

## 2020-12-20 ENCOUNTER — Encounter (HOSPITAL_BASED_OUTPATIENT_CLINIC_OR_DEPARTMENT_OTHER): Payer: Self-pay | Admitting: Urology

## 2022-05-25 IMAGING — DX DG ABDOMEN 1V
2 series · 2 of 2 positions shown · non-contrast
Comparison: December 08, 2020.

CLINICAL DATA: Right ureteral stone.

EXAM:
ABDOMEN - 1 VIEW

[abdomen kub (1 of 2)]
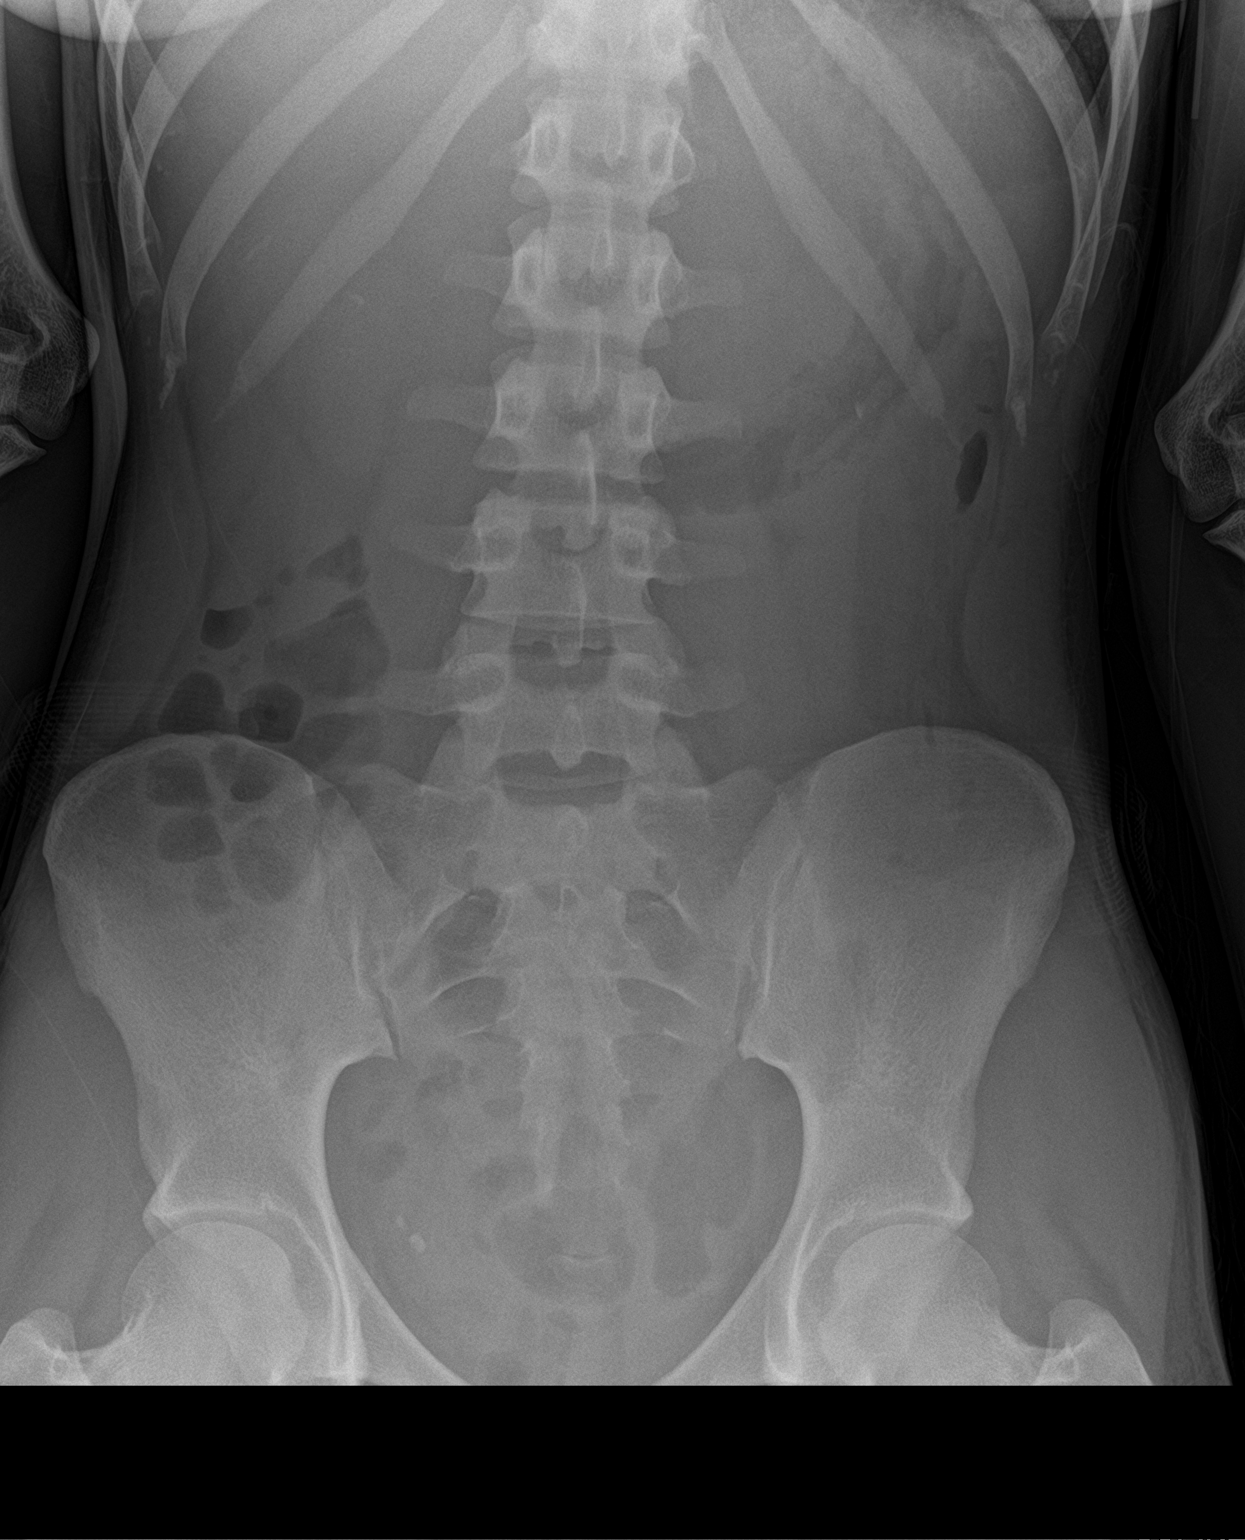

[abdomen kub (2 of 2)]
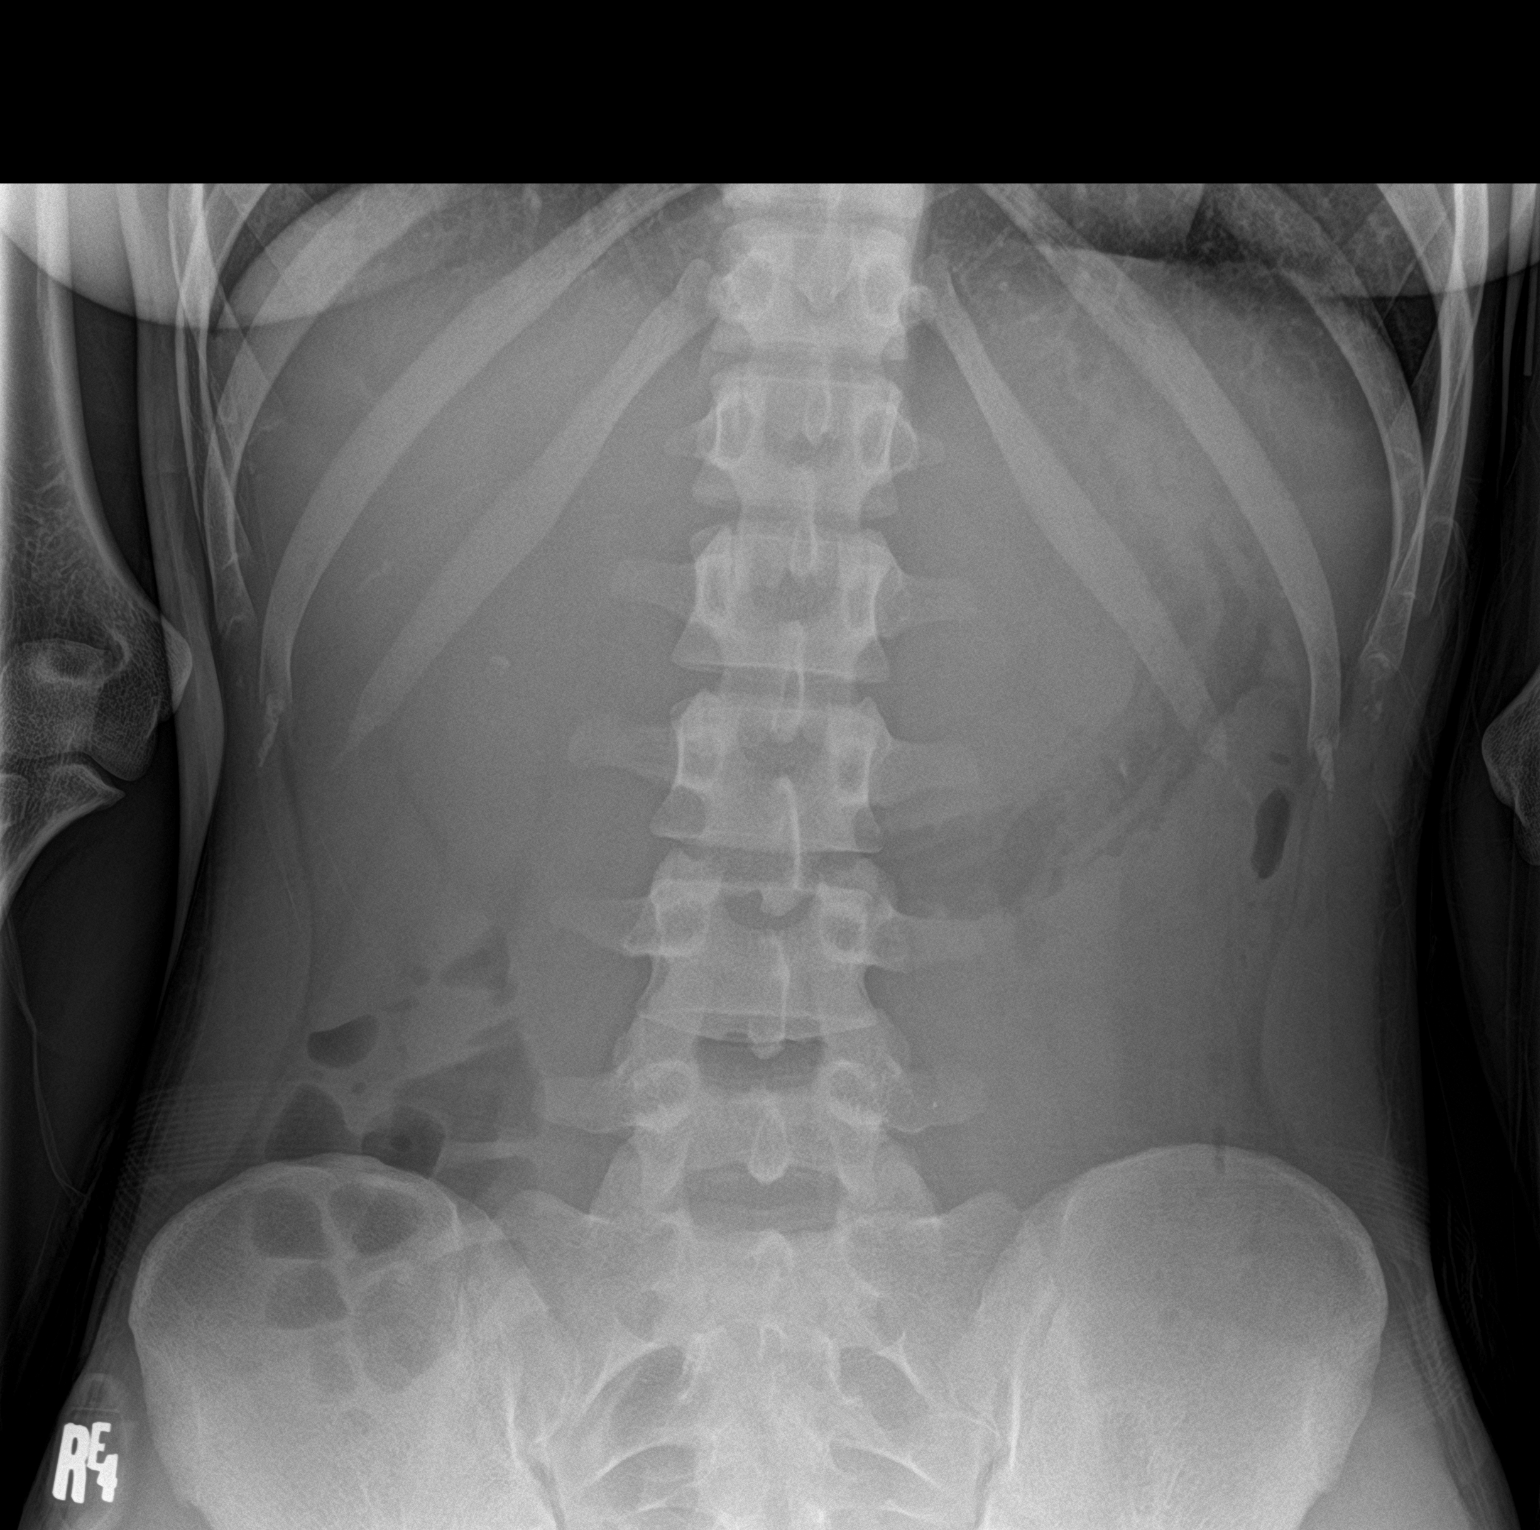

[2 of 2 positions shown; findings below may reference images not displayed]

FINDINGS: The bowel gas pattern is normal. Bilateral nephrolithiasis is noted.
Two rounded calcifications are seen in the right pelvis which are
not seen on prior CT scan and are concerning for distal right
ureteral calculi.
IMPRESSION: Bilateral nephrolithiasis as noted on prior CT scan. Two rounded
calcifications are seen in the right pelvis which are not seen on
prior CT scan and are concerning for distal right ureteral calculi.

## 2022-11-09 ENCOUNTER — Encounter (HOSPITAL_COMMUNITY): Payer: Self-pay

## 2022-11-09 ENCOUNTER — Other Ambulatory Visit: Payer: Self-pay

## 2022-11-09 ENCOUNTER — Emergency Department (HOSPITAL_COMMUNITY)
Admission: EM | Admit: 2022-11-09 | Discharge: 2022-11-10 | Disposition: A | Discharge status: Home / Self Care | Payer: BLUE CROSS/BLUE SHIELD | Attending: Emergency Medicine | Admitting: Emergency Medicine

## 2022-11-09 DIAGNOSIS — M791 Myalgia, unspecified site: Secondary | ICD-10-CM | POA: Diagnosis not present

## 2022-11-09 DIAGNOSIS — R5383 Other fatigue: Secondary | ICD-10-CM | POA: Diagnosis not present

## 2022-11-09 DIAGNOSIS — R5381 Other malaise: Secondary | ICD-10-CM | POA: Insufficient documentation

## 2022-11-09 DIAGNOSIS — Z1152 Encounter for screening for COVID-19: Secondary | ICD-10-CM | POA: Diagnosis not present

## 2022-11-09 DIAGNOSIS — J029 Acute pharyngitis, unspecified: Secondary | ICD-10-CM | POA: Insufficient documentation

## 2022-11-09 DIAGNOSIS — J45909 Unspecified asthma, uncomplicated: Secondary | ICD-10-CM | POA: Insufficient documentation

## 2022-11-09 NOTE — ED Triage Notes (Signed)
Pt. Arrives pov c/o sore throat, muscle aches and nausea x2 days. Pt. States that she cannot stand for a very long time without feeling like she's going to pass out. Pt. States that she has not noticed any fevers.

## 2022-11-10 LAB — RESP PANEL BY RT-PCR (RSV, FLU A&B, COVID)  RVPGX2
Influenza A by PCR: NEGATIVE
Influenza B by PCR: NEGATIVE
Resp Syncytial Virus by PCR: NEGATIVE
SARS Coronavirus 2 by RT PCR: NEGATIVE

## 2022-11-10 LAB — GROUP A STREP BY PCR: Group A Strep by PCR: NOT DETECTED

## 2022-11-10 MED ORDER — NAPROXEN 500 MG PO TABS: 500.0000 mg | ORAL_TABLET | Freq: Two times a day (BID) | ORAL | 0 refills | Status: DC

## 2022-11-10 MED ORDER — ONDANSETRON 4 MG PO TBDP: 4.0000 mg | ORAL_TABLET | Freq: Three times a day (TID) | ORAL | 0 refills | Status: DC | PRN

## 2022-11-10 NOTE — ED Provider Notes (Signed)
[facility]-EMERGENCY DEPT [facility] Emergency Department Provider Note MRN:  [id number]  Arrival date & time: 11/10/22     Chief Complaint   Sore Throat   History of Present Illness   Kelsey Oconnell is a 22 y.o. year-old female with no pertinent past medical history presenting to the ED with chief complaint of sore throat.  Sore throat, body aches, malaise, fatigue for the past few days.  No chest pain or shortness of breath, no abdominal pain, no burning with urination.  Review of Systems  A thorough review of systems was obtained and all systems are negative except as noted in the HPI and PMH.   Patient's Health History    Past Medical History:  Diagnosis Date   Asthma    Kidney stones     Past Surgical History:  Procedure Laterality Date   CYSTOSCOPY/RETROGRADE/URETEROSCOPY/STONE EXTRACTION WITH BASKET Right 12/19/2020   Procedure: CYSTOSCOPY/URETEROSCOPY/STONE EXTRACTION WITH BASKET/ STENT PLACEMENT;  Surgeon: Heloise Purpura, MD;  Location: [facility];  Service: Urology;  Laterality: Right;   LITHOTRIPSY      History reviewed. No pertinent family history.  Social History   Socioeconomic History   Marital status: Single    Spouse name: Not on file   Number of children: Not on file   Years of education: Not on file   Highest education level: Not on file  Occupational History   Not on file  Tobacco Use   Smoking status: Never   Smokeless tobacco: Never  Substance and Sexual Activity   Alcohol use: Not on file   Drug use: Not on file   Sexual activity: Not on file  Other Topics Concern   Not on file  Social History Narrative   Not on file   Social Determinants of Health   Financial Resource Strain: Not on file  Food Insecurity: Not on file  Transportation Needs: Not on file  Physical Activity: Not on file  Stress: Not on file  Social Connections: Not on file  Intimate Partner Violence: Not on file     Physical Exam   Vitals:    11/09/22 2315  BP: 111/74  Pulse: 95  Resp: 20  Temp: 98.4 F (36.9 C)  SpO2: 100%    CONSTITUTIONAL: Well-appearing, NAD NEURO/PSYCH:  Alert and oriented x 3, no focal deficits EYES:  eyes equal and reactive ENT/NECK:  no LAD, no JVD CARDIO: Regular rate, well-perfused, normal S1 and S2 PULM:  CTAB no wheezing or rhonchi GI/GU:  non-distended, non-tender MSK/SPINE:  No gross deformities, no edema SKIN:  no rash, atraumatic   *Additional and/or pertinent findings included in MDM below  Diagnostic and Interventional Summary    EKG Interpretation  Date/Time:    Ventricular Rate:    PR Interval:    QRS Duration:   QT Interval:    QTC Calculation:   R Axis:     Text Interpretation:         Labs Reviewed  GROUP A STREP BY PCR  RESP PANEL BY RT-PCR (RSV, FLU A&B, COVID)  [id number]    No orders to display    Medications - No data to display   Procedures  /  Critical Care Procedures  ED Course and Medical Decision Making  Initial Impression and Ddx Symptoms are suggestive of viral illness.  Mild erythema to the posterior oropharynx without signs of abscess.  Soft abdomen, clear lungs, normal vitals, no meningismus.  Strep throat is considered.  Past medical/surgical history that increases complexity  of ED encounter: None  Interpretation of Diagnostics COVID, flu, RSV, strep negative  Patient Reassessment and Ultimate Disposition/Management     Discharge  Patient management required discussion with the following services or consulting groups:  None  Complexity of Problems Addressed Acute complicated illness or Injury  Additional Data Reviewed and Analyzed Further history obtained from: None  Additional Factors Impacting ED Encounter Risk None  Elmer Sow. Pilar Plate, MD [facility] Emergency Medicine [facility] [email or web address]  Final Clinical Impressions(s) / ED Diagnoses     ICD-10-CM   1. Viral pharyngitis  J02.9       ED  Discharge Orders          Ordered    ondansetron (ZOFRAN-ODT) 4 MG disintegrating tablet  Every 8 hours PRN        11/10/22 0024    naproxen (NAPROSYN) 500 MG tablet  2 times daily        11/10/22 0024             Discharge Instructions Discussed with and Provided to Patient:     Discharge Instructions      You were evaluated in the Emergency Department and after careful evaluation, we did not find any emergent condition requiring admission or further testing in the hospital.  Your exam/testing today is overall reassuring.  Symptoms likely due to viral illness.  Your strep test was negative.  Use the Naprosyn twice daily as needed for aches, pains, discomfort.  Can use the Zofran as needed for nausea.  Please return to the Emergency Department if you experience any worsening of your condition.   Thank you for allowing [place] to be a part of your care.       Sabas Sous, MD 11/10/22 [phone]

## 2022-11-10 NOTE — Discharge Instructions (Signed)
You were evaluated in the Emergency Department and after careful evaluation, we did not find any emergent condition requiring admission or further testing in the hospital.  Your exam/testing today is overall reassuring.  Symptoms likely due to viral illness.  Your strep test was negative.  Use the Naprosyn twice daily as needed for aches, pains, discomfort.  Can use the Zofran as needed for nausea.  Please return to the Emergency Department if you experience any worsening of your condition.   Thank you for allowing [place] to be a part of your care.

## 2022-12-28 ENCOUNTER — Emergency Department (HOSPITAL_COMMUNITY)
Admission: EM | Admit: 2022-12-28 | Discharge: 2022-12-28 | Disposition: A | Discharge status: Home / Self Care | Payer: Medicaid Other | Attending: Emergency Medicine | Admitting: Emergency Medicine

## 2022-12-28 ENCOUNTER — Encounter (HOSPITAL_COMMUNITY): Payer: Self-pay

## 2022-12-28 ENCOUNTER — Other Ambulatory Visit: Payer: Self-pay

## 2022-12-28 DIAGNOSIS — U071 COVID-19: Secondary | ICD-10-CM | POA: Diagnosis not present

## 2022-12-28 DIAGNOSIS — O98511 Other viral diseases complicating pregnancy, first trimester: Secondary | ICD-10-CM | POA: Insufficient documentation

## 2022-12-28 DIAGNOSIS — J45909 Unspecified asthma, uncomplicated: Secondary | ICD-10-CM | POA: Diagnosis not present

## 2022-12-28 DIAGNOSIS — Z3490 Encounter for supervision of normal pregnancy, unspecified, unspecified trimester: Secondary | ICD-10-CM

## 2022-12-28 DIAGNOSIS — O99519 Diseases of the respiratory system complicating pregnancy, unspecified trimester: Secondary | ICD-10-CM | POA: Diagnosis present

## 2022-12-28 LAB — PREGNANCY, URINE: Preg Test, Ur: POSITIVE — AB

## 2022-12-28 LAB — SARS CORONAVIRUS 2 BY RT PCR: SARS Coronavirus 2 by RT PCR: POSITIVE — AB

## 2022-12-28 MED ORDER — PAXLOVID (300/100) 20 X 150 MG & 10 X 100MG PO TBPK: 3.0000 | ORAL_TABLET | Freq: Two times a day (BID) | ORAL | 0 refills | Status: AC

## 2022-12-28 NOTE — ED Provider Notes (Signed)
[place] EMERGENCY DEPARTMENT AT [facility] Provider Note   CSN: [id number] Arrival date & time: 12/28/22  1013     History  Chief Complaint  Patient presents with   Nasal Congestion    Kelsey Oconnell is a 22 y.o. female.  HPI Patient with history of asthma for around 2 to 3 days now shortness of breath cough sputum production.  Feels fatigued.  States he has been sweaty at home.  No known definite sick contacts but states she did go to a club this weekend.  Does have history of asthma.  Not feeling severely short of breath but does feel fatigued.  Also had visit to missed menses.  Had positive pregnancy test at home.  Thinks she may be pregnant and would like to check.  And had 1 episode of abdominal cramping yesterday but no vaginal bleeding or discharge.   Past Medical History:  Diagnosis Date   Asthma    Kidney stones     Home Medications Prior to Admission medications   Medication Sig Start Date End Date Taking? Authorizing Provider  nirmatrelvir & ritonavir (PAXLOVID, 300/100,) 20 x 150 MG & 10 x 100MG  TBPK Take 3 tablets by mouth 2 (two) times daily for 5 days. 12/28/22 01/02/23 Yes Benjiman Core, MD  acetaminophen (TYLENOL) 500 MG tablet Take 1,000 mg by mouth as needed for moderate pain.    [provider]  etonogestrel (NEXPLANON) 68 MG IMPL implant 1 each by Subdermal route once.    [provider]  HYDROcodone-acetaminophen (NORCO/VICODIN) 5-325 MG tablet Take 1-2 tablets by mouth every 6 (six) hours as needed. 12/19/20   Heloise Purpura, MD  naproxen (NAPROSYN) 500 MG tablet Take 1 tablet (500 mg total) by mouth 2 (two) times daily. 11/10/22   Sabas Sous, MD  ondansetron (ZOFRAN-ODT) 4 MG disintegrating tablet Take 1 tablet (4 mg total) by mouth every 8 (eight) hours as needed for nausea or vomiting. 11/10/22   Sabas Sous, MD      Allergies    Patient has no known allergies.    Review of Systems   Review of  Systems  Physical Exam Updated Vital Signs BP 126/87   Pulse 88   Temp 98.4 F (36.9 C) (Oral)   Resp 18   Ht 5\' 4"  (1.626 m)   Wt 52.2 kg   LMP 10/27/2022   SpO2 100%   BMI 19.74 kg/m  Physical Exam Vitals and nursing note reviewed.  HENT:     Head: Atraumatic.  Cardiovascular:     Rate and Rhythm: Regular rhythm.  Pulmonary:     Breath sounds: No wheezing or rhonchi.     Comments: Nose is congested Abdominal:     Tenderness: There is no abdominal tenderness.  Musculoskeletal:        General: No tenderness.  Skin:    Capillary Refill: Capillary refill takes less than 2 seconds.  Neurological:     Mental Status: She is alert.     ED Results / Procedures / Treatments   Labs (all labs ordered are listed, but only abnormal results are displayed) Labs Reviewed  SARS CORONAVIRUS 2 BY RT PCR - Abnormal; Notable for the following components:      Result Value   SARS Coronavirus 2 by RT PCR POSITIVE (*)    All other components within normal limits  PREGNANCY, URINE - Abnormal; Notable for the following components:   Preg Test, Ur POSITIVE (*)    All other  components within normal limits    EKG None  Radiology No results found.  Procedures Procedures    Medications Ordered in ED Medications - No data to display  ED Course/ Medical Decision Making/ A&P                             Medical Decision Making Amount and/or Complexity of Data Reviewed Labs: ordered.  Risk Prescription drug management.   Patient with URI symptoms and cough.  Lungs overall reassuring.  Doubt pneumonia.  However is high risk with asthma.  Will check COVID test since she is 3 days into potentially be treated since she is higher risk.  Also will check urine pregnancy test.  Doubt severe intra-abdominal pathology.  No pain.  Do not think we need verification of pregnancy location if positive.  COVID test and pregnancy both positive.  Reviewed the CDC guidance on pregnancy and  COVID.  She is higher risk due to her asthma.  Prescription given.  Will follow-up with gynecology/OB.  Appears stable for discharge home.        Final Clinical Impression(s) / ED Diagnoses Final diagnoses:  COVID-19  Pregnancy, unspecified gestational age    Rx / DC Orders ED Discharge Orders          Ordered    nirmatrelvir & ritonavir (PAXLOVID, 300/100,) 20 x 150 MG & 10 x 100MG  TBPK  2 times daily        12/28/22 1230              Benjiman Core, MD 12/28/22 1231

## 2022-12-28 NOTE — ED Notes (Signed)
EDP at [facility] 

## 2022-12-28 NOTE — ED Triage Notes (Signed)
Patient reports cough, congestion, and body aches x 3 days. Denies fever.  Also states she recently found out she was pregnant. Denies nausea, vomiting, and abdominal pain.

## 2022-12-28 NOTE — ED Notes (Addendum)
Pt alert, NAD, calm, interactive, resps e/u, speaking in clear complete sentences, sniffling nasal congestion frequently, ambulatory with steady gait. C/o nasal congestion. Endorses dizzy, sob, cough, body aches, and abd cramping intermittently. Denies fever, weakness, pain, vomiting, diarrhea, chest congestion. Mentions 2 recent + pregnancy tests. LMP 5/4. No PCP, no GYN. G1P0. H/o asthma. LS CTA. Denies pain at this time. Denies abd cramping at this time.

## 2023-01-14 ENCOUNTER — Emergency Department (HOSPITAL_BASED_OUTPATIENT_CLINIC_OR_DEPARTMENT_OTHER)
Admission: EM | Admit: 2023-01-14 | Discharge: 2023-01-14 | Disposition: A | Discharge status: Home / Self Care | Payer: Medicaid Other | Source: Home / Self Care | Attending: Emergency Medicine | Admitting: Emergency Medicine

## 2023-01-14 ENCOUNTER — Emergency Department (HOSPITAL_BASED_OUTPATIENT_CLINIC_OR_DEPARTMENT_OTHER): Payer: Medicaid Other

## 2023-01-14 DIAGNOSIS — O26891 Other specified pregnancy related conditions, first trimester: Secondary | ICD-10-CM | POA: Diagnosis not present

## 2023-01-14 DIAGNOSIS — Z3A01 Less than 8 weeks gestation of pregnancy: Secondary | ICD-10-CM | POA: Insufficient documentation

## 2023-01-14 LAB — CBC WITH DIFFERENTIAL/PLATELET
Abs Immature Granulocytes: 0.01 10*3/uL (ref 0.00–0.07)
Basophils Absolute: 0.1 10*3/uL (ref 0.0–0.1)
Basophils Relative: 1 %
Eosinophils Absolute: 0.1 10*3/uL (ref 0.0–0.5)
Eosinophils Relative: 2 %
HCT: 41.7 % (ref 36.0–46.0)
Hemoglobin: 14.2 g/dL (ref 12.0–15.0)
Immature Granulocytes: 0 %
Lymphocytes Relative: 25 %
Lymphs Abs: 1.5 10*3/uL (ref 0.7–4.0)
MCH: 29.3 pg (ref 26.0–34.0)
MCHC: 34.1 g/dL (ref 30.0–36.0)
MCV: 86 fL (ref 80.0–100.0)
Monocytes Absolute: 0.4 10*3/uL (ref 0.1–1.0)
Monocytes Relative: 6 %
Neutro Abs: 3.9 10*3/uL (ref 1.7–7.7)
Neutrophils Relative %: 66 %
Platelets: 269 10*3/uL (ref 150–400)
RBC: 4.85 MIL/uL (ref 3.87–5.11)
RDW: 12.8 % (ref 11.5–15.5)
WBC: 5.8 10*3/uL (ref 4.0–10.5)
nRBC: 0 % (ref 0.0–0.2)

## 2023-01-14 LAB — COMPREHENSIVE METABOLIC PANEL
ALT: 14 U/L (ref 0–44)
AST: 22 U/L (ref 15–41)
Albumin: 4 g/dL (ref 3.5–5.0)
Alkaline Phosphatase: 53 U/L (ref 38–126)
Anion gap: 7 (ref 5–15)
BUN: 9 mg/dL (ref 6–20)
CO2: 24 mmol/L (ref 22–32)
Calcium: 9.5 mg/dL (ref 8.9–10.3)
Chloride: 103 mmol/L (ref 98–111)
Creatinine, Ser: 0.64 mg/dL (ref 0.44–1.00)
GFR, Estimated: >60 mL/min (ref >60)
Glucose, Bld: 108 mg/dL — ABNORMAL HIGH (ref 70–99)
Potassium: 3.7 mmol/L (ref 3.5–5.1)
Sodium: 134 mmol/L — ABNORMAL LOW (ref 135–145)
Total Bilirubin: 0.9 mg/dL (ref 0.3–1.2)
Total Protein: 7.2 g/dL (ref 6.5–8.1)

## 2023-01-14 LAB — LIPASE, BLOOD: Lipase: 36 U/L (ref 11–51)

## 2023-01-14 LAB — URINALYSIS, ROUTINE W REFLEX MICROSCOPIC
Bilirubin Urine: NEGATIVE
Glucose, UA: NEGATIVE mg/dL
Hgb urine dipstick: NEGATIVE
Ketones, ur: NEGATIVE mg/dL
Leukocytes,Ua: NEGATIVE
Nitrite: NEGATIVE
Protein, ur: NEGATIVE mg/dL
Specific Gravity, Urine: 1.02 (ref 1.005–1.030)
pH: 8.5 — ABNORMAL HIGH (ref 5.0–8.0)

## 2023-01-14 LAB — HCG, SERUM, QUALITATIVE: Preg, Serum: POSITIVE — AB

## 2023-01-14 LAB — HCG, QUANTITATIVE, PREGNANCY: hCG, Beta Chain, Quant, S: 33142 m[IU]/mL — ABNORMAL HIGH (ref <5)

## 2023-01-14 NOTE — Discharge Instructions (Addendum)
Please follow-up with an OB of your choosing or the 1 I have attached your for you due to your pregnancy.  You are currently 6 weeks 0 days pregnant today according to the ultrasound.  Your labs today were reassuring and your abdominal cramping is most likely related to your pregnancy.  You may use Metamucil for any constipation and Tylenol for pain.  If symptoms are to change or worsen please go to the MAU at [facility] as they have more resources for pregnant patients.

## 2023-01-14 NOTE — ED Provider Notes (Signed)
[facility] Provider Note   CSN: [id number] Arrival date & time: 01/14/23  0930     History  Chief Complaint  Patient presents with   Abdominal Cramping    Kelsey Oconnell is a 22 y.o. female currently pregnant presenting with abdominal pain that began yesterday.  Patient states that she had her last period 10/27/2022 and denies any vaginal bleeding but states that she is having lower abdominal cramping.  Patient denies any dysuria, vaginal discharge.  Patient denies any fevers.  Patient states this is her first pregnancy and is concerned for the fetus as she has not seen OB yet.  Patient is unsure what makes her cramping better or worse.  Denies any trauma or dysuria.    Home Medications Prior to Admission medications   Medication Sig Start Date End Date Taking? Authorizing Provider  acetaminophen (TYLENOL) 500 MG tablet Take 1,000 mg by mouth as needed for moderate pain.    [provider]  etonogestrel (NEXPLANON) 68 MG IMPL implant 1 each by Subdermal route once.    [provider]  HYDROcodone-acetaminophen (NORCO/VICODIN) 5-325 MG tablet Take 1-2 tablets by mouth every 6 (six) hours as needed. 12/19/20   Heloise Purpura, MD  naproxen (NAPROSYN) 500 MG tablet Take 1 tablet (500 mg total) by mouth 2 (two) times daily. 11/10/22   Sabas Sous, MD  ondansetron (ZOFRAN-ODT) 4 MG disintegrating tablet Take 1 tablet (4 mg total) by mouth every 8 (eight) hours as needed for nausea or vomiting. 11/10/22   Sabas Sous, MD      Allergies    Patient has no known allergies.    Review of Systems   Review of Systems See HPI Physical Exam Updated Vital Signs BP 125/70 (BP Location: Left Arm)   Pulse 92   Temp 98.1 F (36.7 C) (Oral)   Resp 16   Ht 5\' 4"  (1.626 m)   Wt 54.4 kg   LMP 10/27/2022   SpO2 100%   BMI 20.60 kg/m  Physical Exam Vitals reviewed.  Constitutional:      General: She is not in acute  distress. HENT:     Head: Normocephalic and atraumatic.  Eyes:     Extraocular Movements: Extraocular movements intact.     Conjunctiva/sclera: Conjunctivae normal.     Pupils: Pupils are equal, round, and reactive to light.  Cardiovascular:     Rate and Rhythm: Normal rate and regular rhythm.     Pulses: Normal pulses.     Heart sounds: Normal heart sounds.     Comments: 2+ bilateral radial/dorsalis pedis pulses with regular rate Pulmonary:     Effort: Pulmonary effort is normal. No respiratory distress.     Breath sounds: Normal breath sounds.  Abdominal:     Palpations: Abdomen is soft.     Tenderness: There is no abdominal tenderness. There is no guarding or rebound.  Musculoskeletal:        General: Normal range of motion.     Cervical back: Normal range of motion and neck supple.     Comments: 5 out of 5 bilateral grip/leg extension strength  Skin:    General: Skin is warm and dry.     Capillary Refill: Capillary refill takes less than 2 seconds.  Neurological:     General: No focal deficit present.     Mental Status: She is alert and oriented to person, place, and time.     Comments: Sensation intact in  all 4 limbs  Psychiatric:        Mood and Affect: Mood normal.     ED Results / Procedures / Treatments   Labs (all labs ordered are listed, but only abnormal results are displayed) Labs Reviewed  COMPREHENSIVE METABOLIC PANEL - Abnormal; Notable for the following components:      Result Value   Sodium 134 (*)    Glucose, Bld 108 (*)    All other components within normal limits  HCG, SERUM, QUALITATIVE - Abnormal; Notable for the following components:   Preg, Serum POSITIVE (*)    All other components within normal limits  HCG, QUANTITATIVE, PREGNANCY - Abnormal; Notable for the following components:   hCG, Beta Chain, Quant, S 33,142 (*)    All other components within normal limits  URINALYSIS, ROUTINE W REFLEX MICROSCOPIC - Abnormal; Notable for the following  components:   pH 8.5 (*)    All other components within normal limits  CBC WITH DIFFERENTIAL/PLATELET  LIPASE, BLOOD    EKG None  Radiology US OB LESS THAN 14 WEEKS WITH OB TRANSVAGINAL  Result Date: 01/14/2023 CLINICAL DATA:  [id number] Pelvic cramping [id number] EXAM: OBSTETRIC <14 WK [place] AND TRANSVAGINAL OB US TECHNIQUE: Both transabdominal and transvaginal ultrasound examinations were performed for complete evaluation of the gestation as well as the maternal uterus, adnexal regions, and pelvic cul-de-sac. Transvaginal technique was performed to assess early pregnancy. COMPARISON:  None Available. FINDINGS: Intrauterine gestational sac: Single Yolk sac:  Visualized. Embryo:  Visualized. Cardiac Activity: Visualized. Heart Rate: 125 bpm CRL: 3.9 mm mm   6 w   0 d                  [place] EDC: 09/09/2023. Subchorionic hemorrhage:  None visualized. Maternal uterus/adnexae: Unremarkable. IMPRESSION: *Single live intrauterine gestation with crown-rump length of 6 weeks 0 days. Electronically Signed   By: Jules Schick M.D.   On: 01/14/2023 11:43    Procedures Procedures    Medications Ordered in ED Medications - No data to display  ED Course/ Medical Decision Making/ A&P                             Medical Decision Making Amount and/or Complexity of Data Reviewed Labs: ordered. Radiology: ordered.   Kelsey Oconnell 21 y.o. presented today for abdominal cramping. Working DDx that I considered at this time includes, but not limited to, pregnancy, gastroenteritis, colitis, small bowel obstruction, appendicitis, cholecystitis, pancreatitis, nephrolithiasis, AAA, UTI, pyelonephritis, ruptured ectopic pregnancy, PID, ovarian torsion.  R/o DDx: gastroenteritis, colitis, small bowel obstruction, appendicitis, cholecystitis, pancreatitis, nephrolithiasis, AAA, UTI, pyelonephritis, ruptured ectopic pregnancy, PID, ovarian torsion: These are considered less likely due to history of present illness and  physical exam findings.  Review of prior external notes: 12/28/2022 ED  Unique Tests and My Interpretation:  CBC with differential: Unremarkable CMP: Unremarkable Serum qualitative: Positive Serum quantitative: 32,000 142 Lipase: Unremarkable [id number] Pelvic ultrasound: 6 weeks 0 days, no acute changes  Discussion with Independent Historian: None  Discussion of Management of Tests: None  Risk: Low: based on diagnostic testing/clinical impression and treatment plan  Risk Stratification Score: None  Plan: On exam patient was no acute distress and resting comfortably.  Patient had unremarkable physical exam and labs from triage were ultimately reassuring.  Patient's beta-hCG is in the normal range that would be expected as her ultrasound shows that she is 6 weeks and 0 days pregnant today.  Patient  does not endorsing any vaginal bleeding and we had shared decision making and decided to forego a pelvic exam as patient does not have any vaginal bleeding or discharge and her abdominal cramping is most likely related to her pregnancy.  I encouraged the patient to use Tylenol every 6 hours as needed for pain and to follow-up with an OB for her pregnancy care.  I also encouraged the patient to go to the [facility] if symptoms are to change or worsen as they have more resources for pregnant patients.  Pending patient's Patient may be discharged.  Patient's urine is reassuring and will be discharged with outpatient follow-up.  Patient was given return precautions. Patient stable for discharge at this time.  Patient verbalized understanding of plan.        Final Clinical Impression(s) / ED Diagnoses Final diagnoses:  Less than [redacted] weeks gestation of pregnancy    Rx / DC Orders ED Discharge Orders     None         Remi Deter 01/14/23 1249    Laurence Spates, MD 01/15/23 [phone]

## 2023-01-14 NOTE — ED Triage Notes (Signed)
Patient presents to ED via POV from home. Here due to abdominal cramping. Patient is pregnant. Last menstrual period was 05/04. Patient requesting "to check on the baby". Denies vaginal bleeding. Denies dysuria.

## 2023-01-14 NOTE — ED Notes (Signed)
Called lab to add on quantitative hcg to previously collected labwork.

## 2023-03-27 ENCOUNTER — Ambulatory Visit
Admission: EM | Admit: 2023-03-27 | Discharge: 2023-03-27 | Disposition: A | Discharge status: Home / Self Care | Payer: Medicaid Other | Attending: Internal Medicine | Admitting: Internal Medicine

## 2023-03-27 DIAGNOSIS — N760 Acute vaginitis: Secondary | ICD-10-CM | POA: Diagnosis not present

## 2023-03-27 DIAGNOSIS — Z3A16 16 weeks gestation of pregnancy: Secondary | ICD-10-CM | POA: Diagnosis present

## 2023-03-27 NOTE — ED Triage Notes (Signed)
Pt states she is [redacted] wks pregnant. States went down to see her baby daddy this weekend and had unprotected intercourse. States having a yellow/greenish vaginal discharge with odor, itching, and redness from scratching since Sunday.

## 2023-03-27 NOTE — Discharge Instructions (Addendum)
We will let you know about your test results tomorrow.  If you have not heard from our clinic by noon, reach out to [place] directly and we will do a thorough review for you.  For now would recommend Tylenol for any abdominal pains or cramps.  If you develop vaginal bleeding or severe pelvic pain then please report to the [facility].  Otherwise, we will treat any infection that you test positive for from the vaginal sample you gave [place] today.

## 2023-03-27 NOTE — ED Provider Notes (Signed)
[facility]  Note:  This document was prepared using Conservation officer, historic buildings and may include unintentional dictation errors.  MRN: [id number] DOB: 22-Oct-2000  Subjective:   Kelsey Oconnell is a 22 y.o. female [redacted] weeks pregnant presenting for 3-day history of persistent vaginal discharge with malodor, vaginal itching and irritation.  Patient has started to have mild intermittent lower abdominal cramps.  No vaginal bleeding, urinary symptoms.  She would like to be checked for STIs as she just had sex with the baby's father over the weekend, did not use protection.  No current facility-administered medications for this encounter.  Current Outpatient Medications:    acetaminophen (TYLENOL) 500 MG tablet, Take 1,000 mg by mouth as needed for moderate pain., Disp: , Rfl:    etonogestrel (NEXPLANON) 68 MG IMPL implant, 1 each by Subdermal route once., Disp: , Rfl:    HYDROcodone-acetaminophen (NORCO/VICODIN) 5-325 MG tablet, Take 1-2 tablets by mouth every 6 (six) hours as needed., Disp: 25 tablet, Rfl: 0   naproxen (NAPROSYN) 500 MG tablet, Take 1 tablet (500 mg total) by mouth 2 (two) times daily., Disp: 30 tablet, Rfl: 0   ondansetron (ZOFRAN-ODT) 4 MG disintegrating tablet, Take 1 tablet (4 mg total) by mouth every 8 (eight) hours as needed for nausea or vomiting., Disp: 20 tablet, Rfl: 0   No Known Allergies  Past Medical History:  Diagnosis Date   Asthma    Kidney stones      Past Surgical History:  Procedure Laterality Date   CYSTOSCOPY/RETROGRADE/URETEROSCOPY/STONE EXTRACTION WITH BASKET Right 12/19/2020   Procedure: CYSTOSCOPY/URETEROSCOPY/STONE EXTRACTION WITH BASKET/ STENT PLACEMENT;  Surgeon: Heloise Purpura, MD;  [facility]: [facility];  Service: Urology;  Laterality: Right;   LITHOTRIPSY      No family history on file.  Social History   Tobacco Use   Smoking status: Never   Smokeless tobacco: Never    ROS   Objective:    Vitals: BP 135/78 (BP Location: Left Arm)   Pulse 89   Temp 99.2 F (37.3 C) (Oral)   Resp 16   LMP 10/27/2022   SpO2 98%   Physical Exam Constitutional:      General: She is not in acute distress.    Appearance: Normal appearance. She is well-developed. She is not ill-appearing, toxic-appearing or diaphoretic.  HENT:     Head: Normocephalic and atraumatic.     Nose: Nose normal.     Mouth/Throat:     Mouth: Mucous membranes are moist.     Pharynx: Oropharynx is clear.  Eyes:     General: No scleral icterus.       Right eye: No discharge.        Left eye: No discharge.     Extraocular Movements: Extraocular movements intact.     Conjunctiva/sclera: Conjunctivae normal.  Cardiovascular:     Rate and Rhythm: Normal rate.  Pulmonary:     Effort: Pulmonary effort is normal.  Abdominal:     General: Bowel sounds are normal. There is no distension.     Palpations: Abdomen is soft. There is no mass.     Tenderness: There is no abdominal tenderness. There is no right CVA tenderness, left CVA tenderness, guarding or rebound.  Skin:    General: Skin is warm and dry.  Neurological:     General: No focal deficit present.     Mental Status: She is alert and oriented to person, place, and time.  Psychiatric:  Mood and Affect: Mood normal.        Behavior: Behavior normal.        Thought Content: Thought content normal.        Judgment: Judgment normal.     Assessment and Plan :   PDMP not reviewed this encounter.  1. Acute vaginitis   2. [redacted] weeks gestation of pregnancy    Deferred urinalysis given no urinary symptoms.  Vaginal cytology pending, will treat as appropriate based off of any positive lab results.  Counseled patient on potential for adverse effects with medications prescribed/recommended today, ER and return-to-clinic precautions discussed, patient verbalized understanding.    Wallis Bamberg, PA-C 03/27/23 1210

## 2023-03-28 ENCOUNTER — Telehealth: Payer: Self-pay

## 2023-03-28 MED ORDER — CLOTRIMAZOLE 3 2 % VA CREA: 1.0000 | TOPICAL_CREAM | Freq: Every day | VAGINAL | 0 refills | Status: AC

## 2023-03-28 MED ORDER — CLOTRIMAZOLE 2 % VA CREA: 1.0000 | TOPICAL_CREAM | Freq: Every day | VAGINAL | Status: AC

## 2023-03-28 MED ORDER — METRONIDAZOLE 0.75 % VA GEL: 1.0000 | Freq: Every day | VAGINAL | 0 refills | Status: AC

## 2023-03-28 NOTE — Telephone Encounter (Signed)
Per protocol, pt requires tx with metrogel andd clotrimazole.  Reviewed with patient, verified pharmacy, prescription sent.

## 2023-03-28 NOTE — Addendum Note (Signed)
Addended by: Warren Danes on: 03/28/2023 04:20 PM   Modules accepted: Orders

## 2023-04-01 LAB — CERVICOVAGINAL ANCILLARY ONLY
Bacterial Vaginitis (gardnerella): POSITIVE — AB
Candida Glabrata: NEGATIVE
Candida Vaginitis: POSITIVE — AB
Chlamydia: NEGATIVE
Comment: NEGATIVE
Comment: NEGATIVE
Comment: NEGATIVE
Comment: NEGATIVE
Comment: NEGATIVE
Comment: NORMAL
Neisseria Gonorrhea: NEGATIVE
Trichomonas: NEGATIVE
